# Patient Record
Sex: Male | Born: 1995
Health system: Southern US, Community
[De-identification: ages and names within clinical notes are randomized; demographics above are authoritative.]

---
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
## 2016-04-19 DIAGNOSIS — E291 Testicular hypofunction: Secondary | ICD-10-CM | POA: Diagnosis not present

## 2017-01-03 DIAGNOSIS — Z23 Encounter for immunization: Secondary | ICD-10-CM | POA: Diagnosis not present

## 2017-01-03 DIAGNOSIS — E230 Hypopituitarism: Secondary | ICD-10-CM | POA: Diagnosis not present

## 2017-01-10 IMAGING — MR MR HEAD WO/W CM
15 of 19 series · 35 of 48 positions shown · IV contrast (multihance)
Comparison: None.

CLINICAL DATA: 18-year, four-month-old male with delayed puberty.
Hypogonadism. Recently treated with testosterone injections. Initial
encounter.

EXAM:
MRI HEAD WITHOUT AND WITH CONTRAST
TECHNIQUE: Multiplanar, multiecho pulse sequences of the brain and surrounding
structures were obtained without and with intravenous contrast.
CONTRAST:  8mL MULTIHANCE GADOBENATE DIMEGLUMINE 529 MG/ML IV SOLN

[Series 2: T1 · sagittal · 5.0mm · 0.45mm/px · 3 of 19 slices shown]
[im 1/19]
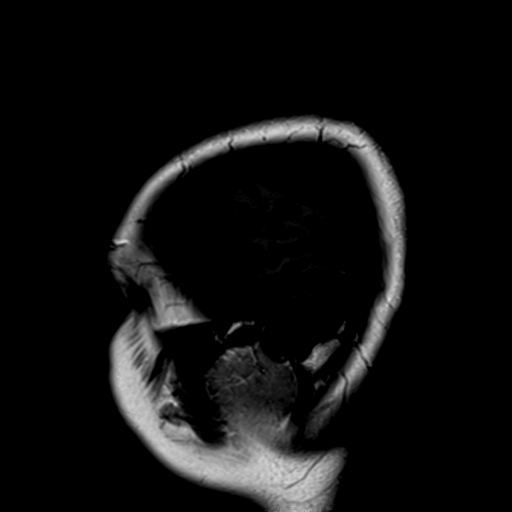
[im 10/19]
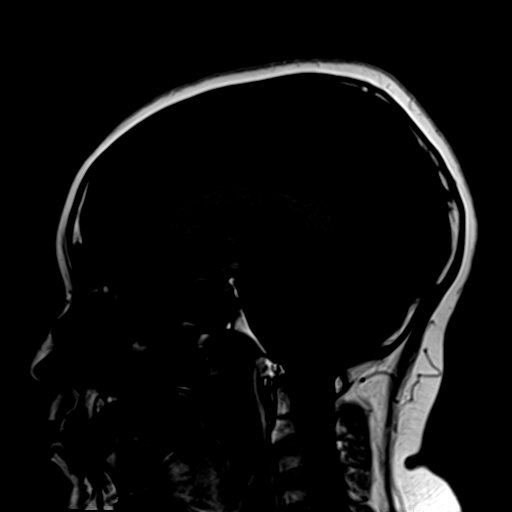
[im 19/19]
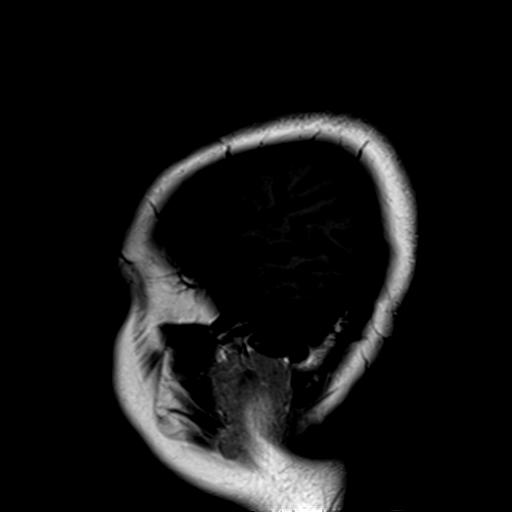

[Series 3: DWI · axial · 3.0mm · 1.80mm/px · z∈[-55,+92]mm · 11 of 100 slices shown]
[im 1/100]
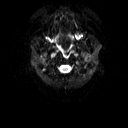
[im 10/100]
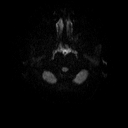
[im 20/100]
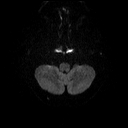
[im 30/100]
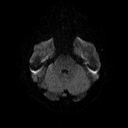
[im 40/100]
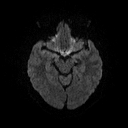
[im 50/100]
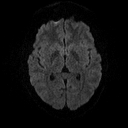
[im 60/100]
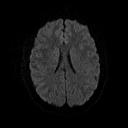
[im 70/100]
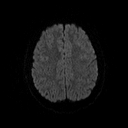
[im 80/100]
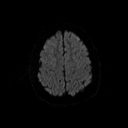
[im 90/100]
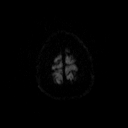
[im 100/100]
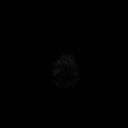

[Series 4: dwi_adc · axial · 3.0mm · 1.80mm/px · z∈[-55,+92]mm · 6 of 50 slices shown]
[im 1/50]
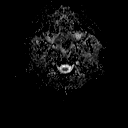
[im 10/50]
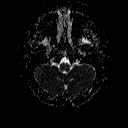
[im 20/50]
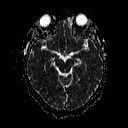
[im 30/50]
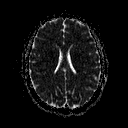
[im 40/50]
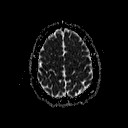
[im 50/50]
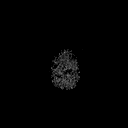

[Series 5: T2 · axial · 5.0mm · 0.36mm/px · z∈[-49,+87]mm · 2 of 22 slices shown]
[im 1/22]
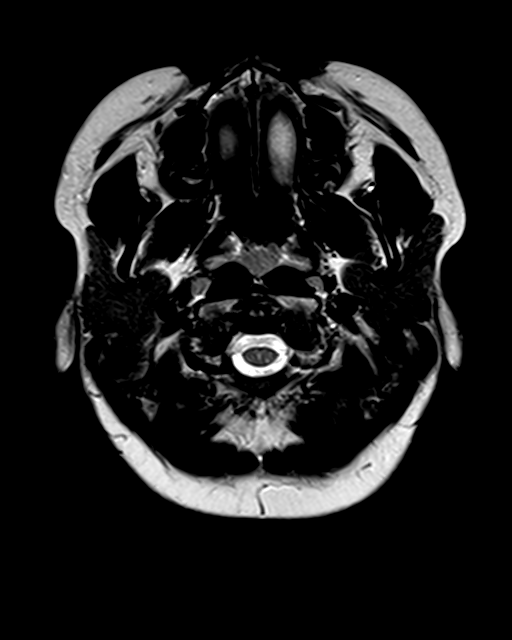
[im 22/22]
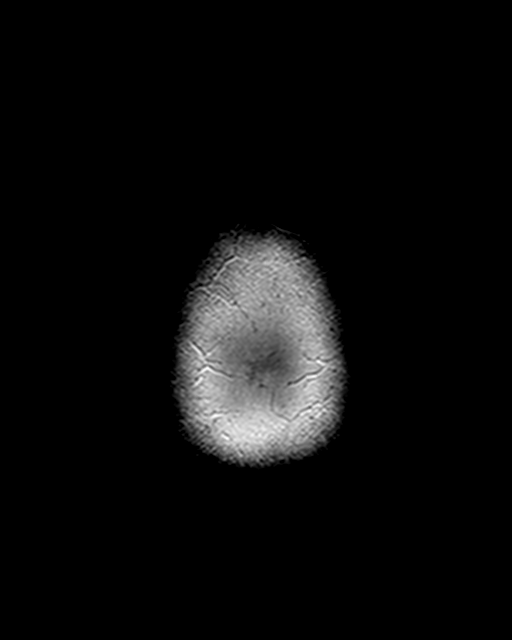

[Series 6: FLAIR · axial · 5.0mm · 0.45mm/px · z∈[-50,+86]mm · 2 of 22 slices shown]
[im 1/22]
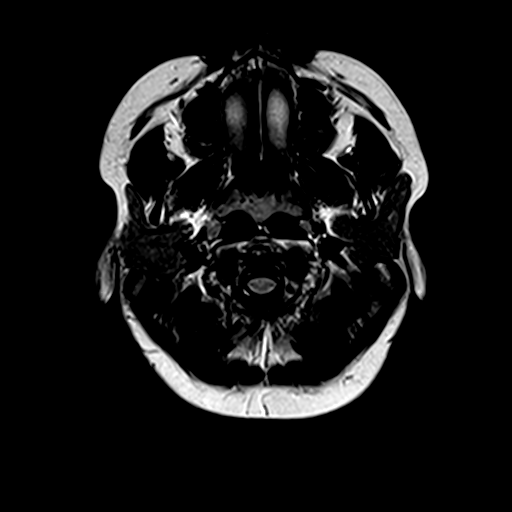
[im 22/22]
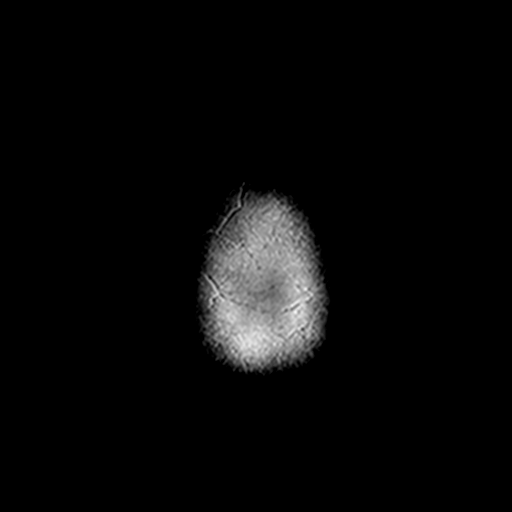

[Series 7: axial grad (blood) · axial · 5.0mm · 0.45mm/px · z∈[-49,+87]mm · 2 of 22 slices shown]
[im 1/22]
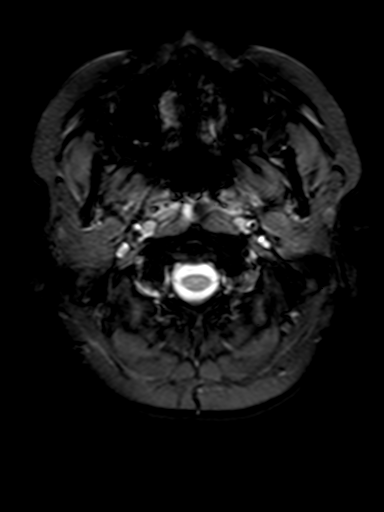
[im 22/22]
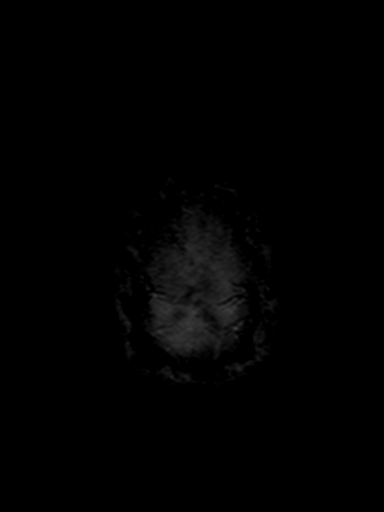

[Series 8: sag 3mm · sagittal · 3.0mm · 0.33mm/px · 1 of 11 slices shown]
[im 1/11]
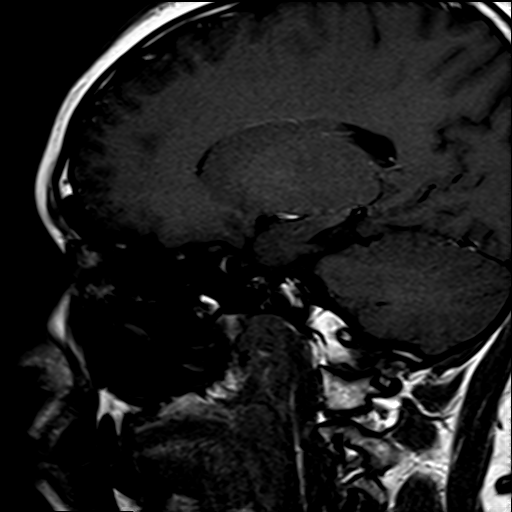

[Series 9: cor 3mm · coronal · 3.0mm · 0.33mm/px · 1 of 11 slices shown]
[im 1/11]
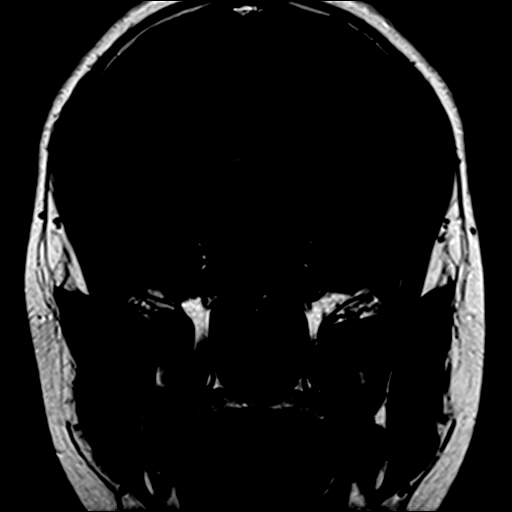

[Series 10: pre cor dynamic · coronal · non-contrast · 3.0mm · 0.35mm/px · 1 of 7 slices shown]
[im 1/7]
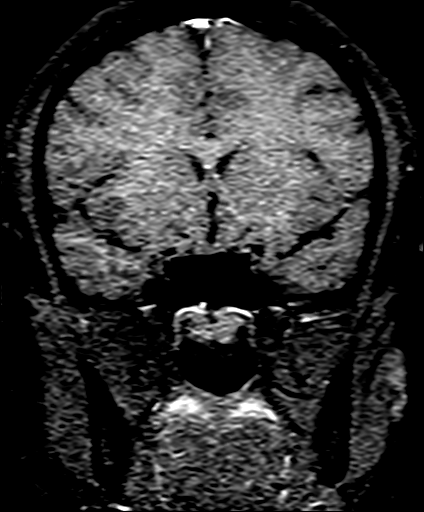

[Series 11: post fs cor · coronal · 3.0mm · 0.35mm/px · 1 of 7 slices shown (1 of 6)]
[im 1/7]
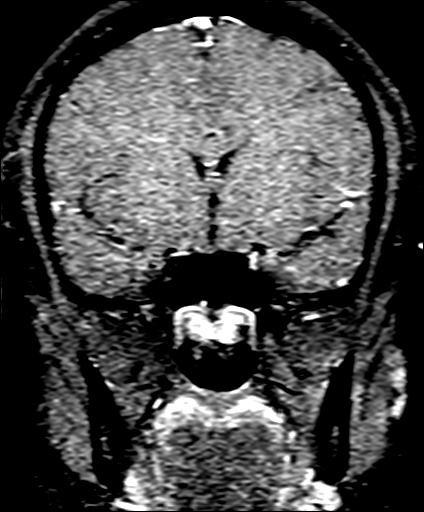

[Series 12: post fs cor · coronal · 3.0mm · 0.35mm/px · 1 of 7 slices shown (2 of 6)]
[im 1/7]
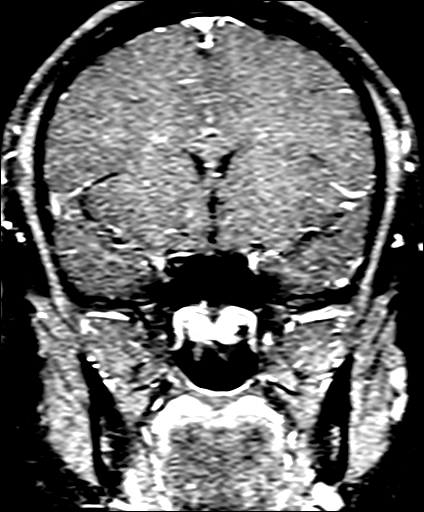

[Series 13: post fs cor · coronal · 3.0mm · 0.35mm/px · 1 of 7 slices shown (3 of 6)]
[im 1/7]
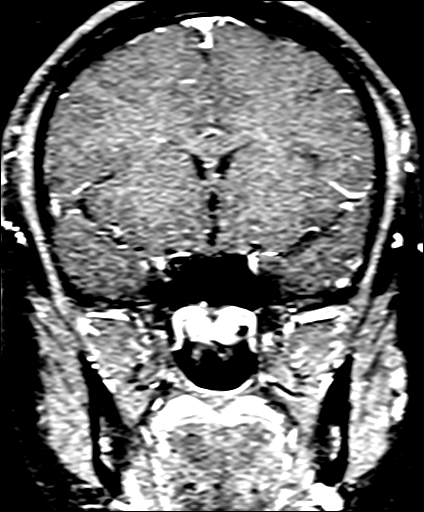

[Series 14: post fs cor · coronal · 3.0mm · 0.35mm/px · 1 of 7 slices shown (4 of 6)]
[im 1/7]
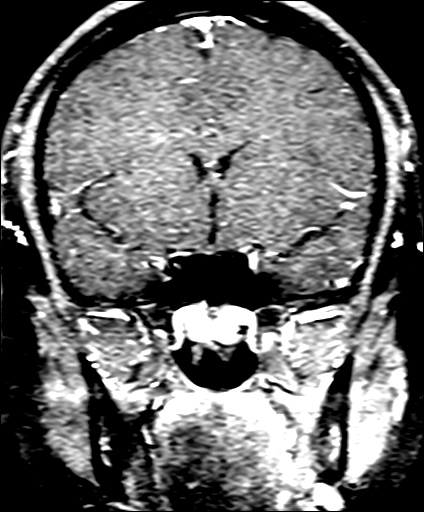

[Series 15: post fs cor · coronal · 3.0mm · 0.35mm/px · 1 of 7 slices shown (5 of 6)]
[im 1/7]
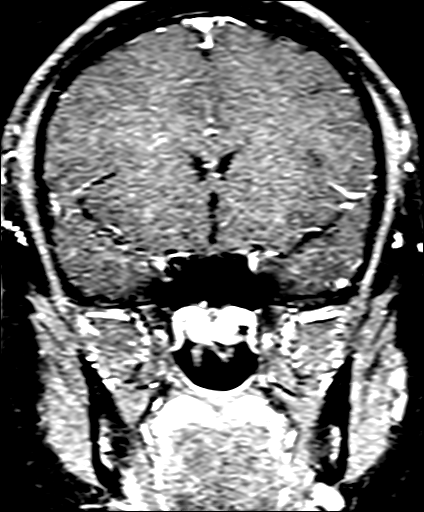

[Series 16: post fs cor · coronal · 3.0mm · 0.35mm/px · 1 of 7 slices shown (6 of 6)]
[im 1/7]
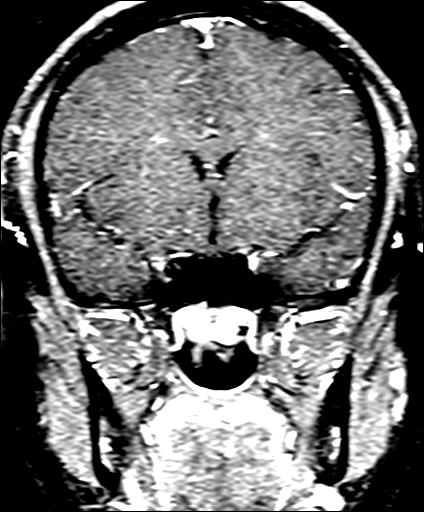

[35 of 48 positions shown; findings below may reference images not displayed]

FINDINGS: Small sella and small pituitary gland with slightly heterogeneous
enhancement without focal mass. Ectopic pituitary bright spot
located at the level of the hypothalamus. This has been described
with pituitary dwarfism.

Superior aspect of the clivus with curvilinear hypointensity which
may represent delayed fusion of the synchondrosis. Primary
notochordal abnormality is a less likely consideration. Attention to
this on follow.

Limited imaging of olfactory region. I suspect the olfactory nerves
are intact. If the patient had anosmia than high-resolution T2
weighted coronal imaging through this region may be considered.

Very prominent adenoidal tissue. Mild prominence palatine tonsils.
Prominent upper neck lymph nodes. These findings may be normal for
this patient and reflect reactive changes from recent infection.
Correlation with CBC and differential to exclude lymphoma may be
considered.

No acute infarct.

No intracranial hemorrhage.

No hydrocephalus.

Cervical medullary junction, pineal region and orbital structures
unremarkable.

Major intracranial vascular structures are patent.
IMPRESSION: Small sella and small pituitary gland with slightly heterogeneous
enhancement without focal mass. Ectopic pituitary bright spot
located at the level of the hypothalamus. This has been described
with pituitary dwarfism.

Suspect delayed fusion of the synchondrosis of the clivus. Attention
to this on follow.

Very prominent adenoidal tissue. Mild prominence palatine tonsils.
Prominent upper neck lymph nodes. These findings may be normal for
this patient and reflect reactive changes from recent infection.
Correlation with CBC and differential to exclude lymphoma may be
considered.

Please see above.

## 2017-07-04 DIAGNOSIS — E230 Hypopituitarism: Secondary | ICD-10-CM | POA: Diagnosis not present

## 2018-03-09 DIAGNOSIS — J029 Acute pharyngitis, unspecified: Secondary | ICD-10-CM | POA: Diagnosis not present

## 2018-03-09 DIAGNOSIS — Z6831 Body mass index (BMI) 31.0-31.9, adult: Secondary | ICD-10-CM | POA: Diagnosis not present

## 2018-03-09 DIAGNOSIS — E6609 Other obesity due to excess calories: Secondary | ICD-10-CM | POA: Diagnosis not present

## 2018-03-09 DIAGNOSIS — Z1389 Encounter for screening for other disorder: Secondary | ICD-10-CM | POA: Diagnosis not present

## 2018-03-09 DIAGNOSIS — J019 Acute sinusitis, unspecified: Secondary | ICD-10-CM | POA: Diagnosis not present

## 2018-06-26 DIAGNOSIS — E230 Hypopituitarism: Secondary | ICD-10-CM | POA: Diagnosis not present

## 2018-10-30 DIAGNOSIS — E230 Hypopituitarism: Secondary | ICD-10-CM | POA: Diagnosis not present

## 2018-10-30 DIAGNOSIS — N62 Hypertrophy of breast: Secondary | ICD-10-CM | POA: Diagnosis not present

## 2018-12-17 DIAGNOSIS — J029 Acute pharyngitis, unspecified: Secondary | ICD-10-CM | POA: Diagnosis not present

## 2018-12-17 DIAGNOSIS — E6609 Other obesity due to excess calories: Secondary | ICD-10-CM | POA: Diagnosis not present

## 2018-12-17 DIAGNOSIS — Z6831 Body mass index (BMI) 31.0-31.9, adult: Secondary | ICD-10-CM | POA: Diagnosis not present

## 2019-03-02 DIAGNOSIS — Z23 Encounter for immunization: Secondary | ICD-10-CM | POA: Diagnosis not present

## 2019-03-24 DIAGNOSIS — Z6831 Body mass index (BMI) 31.0-31.9, adult: Secondary | ICD-10-CM | POA: Diagnosis not present

## 2019-03-24 DIAGNOSIS — R7989 Other specified abnormal findings of blood chemistry: Secondary | ICD-10-CM | POA: Diagnosis not present

## 2019-03-24 DIAGNOSIS — E6609 Other obesity due to excess calories: Secondary | ICD-10-CM | POA: Diagnosis not present

## 2019-03-24 DIAGNOSIS — Z1389 Encounter for screening for other disorder: Secondary | ICD-10-CM | POA: Diagnosis not present

## 2019-03-24 DIAGNOSIS — Z0001 Encounter for general adult medical examination with abnormal findings: Secondary | ICD-10-CM | POA: Diagnosis not present

## 2019-04-27 DIAGNOSIS — Z6830 Body mass index (BMI) 30.0-30.9, adult: Secondary | ICD-10-CM | POA: Diagnosis not present

## 2019-04-27 DIAGNOSIS — E6609 Other obesity due to excess calories: Secondary | ICD-10-CM | POA: Diagnosis not present

## 2019-04-27 DIAGNOSIS — K7689 Other specified diseases of liver: Secondary | ICD-10-CM | POA: Diagnosis not present

## 2019-04-27 DIAGNOSIS — Z23 Encounter for immunization: Secondary | ICD-10-CM | POA: Diagnosis not present

## 2019-04-30 DIAGNOSIS — E230 Hypopituitarism: Secondary | ICD-10-CM | POA: Diagnosis not present

## 2019-05-31 ENCOUNTER — Ambulatory Visit: Payer: Federal, State, Local not specified - PPO | Admitting: Allergy & Immunology

## 2019-06-01 ENCOUNTER — Ambulatory Visit: Payer: Federal, State, Local not specified - PPO | Admitting: Allergy & Immunology

## 2019-06-02 ENCOUNTER — Other Ambulatory Visit: Payer: Self-pay

## 2019-06-02 ENCOUNTER — Encounter: Payer: Self-pay | Admitting: Allergy & Immunology

## 2019-06-02 ENCOUNTER — Ambulatory Visit (INDEPENDENT_AMBULATORY_CARE_PROVIDER_SITE_OTHER): Payer: Federal, State, Local not specified - PPO | Admitting: Allergy & Immunology

## 2019-06-02 VITALS — BP 140/76 | HR 87 | Temp 97.1°F | Resp 16 | Ht 67.72 in | Wt 202.0 lb

## 2019-06-02 DIAGNOSIS — J302 Other seasonal allergic rhinitis: Secondary | ICD-10-CM | POA: Insufficient documentation

## 2019-06-02 DIAGNOSIS — K9049 Malabsorption due to intolerance, not elsewhere classified: Secondary | ICD-10-CM | POA: Diagnosis not present

## 2019-06-02 DIAGNOSIS — J3089 Other allergic rhinitis: Secondary | ICD-10-CM | POA: Diagnosis not present

## 2019-06-02 MED ORDER — MONTELUKAST SODIUM 10 MG PO TABS: 10.0000 mg | ORAL_TABLET | Freq: Every day | ORAL | 5 refills | Status: AC

## 2019-06-02 MED ORDER — TRIAMCINOLONE ACETONIDE 55 MCG/ACT NA AERO: 1.0000 | INHALATION_SPRAY | Freq: Every day | NASAL | 5 refills | Status: AC

## 2019-06-02 NOTE — Patient Instructions (Addendum)
1. Chronic rhinitis - Testing today showed: grasses, weeds, trees, indoor molds, outdoor molds, dust mites, cat, dog and cockroach - Copy of test results provided.  - Avoidance measures provided. - I think we need to be more aggressive with medications to control these symptoms to help with your sleeping at night.  - I would definitely add on a room HEPA filter to help decrease the dander in your room.   - Continue with: Zyrtec (cetirizine) 10mg  tablet once daily (but try to take every day) - Start taking: Singulair (montelukast) 10mg  daily and Nasacort (triamcinolone) one spray per nostril daily - You can use an extra dose of the antihistamine, if needed, for breakthrough symptoms.  - Consider nasal saline rinses 1-2 times daily to remove allergens from the nasal cavities as well as help with mucous clearance (this is especially helpful to do before the nasal sprays are given) - Consider allergy shots as a means of long-term control. - Allergy shots "re-train" and "reset" the immune system to ignore environmental allergens and decrease the resulting immune response to those allergens (sneezing, itchy watery eyes, runny nose, nasal congestion, etc).    - Allergy shots improve symptoms in 75-85% of patients.  - We can discuss more at the next appointment if the medications are not working for you.  2. Food intolerance - Testing was negative to tomato.  - The time course is just no consistent with a food allergy.   3. Return in about 6 weeks (around 07/14/2019). This can be an in-person, a virtual Webex or a telephone follow up visit.   Please inform 1994 of any Emergency Department visits, hospitalizations, or changes in symptoms. Call 09/13/2019 before going to the ED for breathing or allergy symptoms since we might be able to fit you in for a sick visit. Feel free to contact us anytime with any questions, problems, or concerns.  It was a pleasure to meet you today!  Websites that have reliable  patient information: 1. American Academy of Asthma, Allergy, and Immunology: www.aaaai.org 2. Food Allergy Research and Education (FARE): foodallergy.org 3. Mothers of Asthmatics: http://www.asthmacommunitynetwork.org 4. American College of Allergy, Asthma, and Immunology: www.acaai.org   COVID-19 Vaccine Information can be found at: [place] For questions related to vaccine distribution or appointments, please email [email or web address] or call [phone].     "Like" [email or web address] on Facebook and Instagram for our latest updates!       HAPPY SPRING!  Make sure you are registered to vote! If you have moved or changed any of your contact information, you will need to get this updated before voting!  In some cases, you MAY be able to register to vote online: [phone]    Reducing Pollen Exposure  The American Academy of Allergy, Asthma and Immunology suggests the following steps to reduce your exposure to pollen during allergy seasons.    1. Do not hang sheets or clothing out to dry; pollen may collect on these items. 2. Do not mow lawns or spend time around freshly cut grass; mowing stirs up pollen. 3. Keep windows closed at night.  Keep car windows closed while driving. 4. Minimize morning activities outdoors, a time when pollen counts are usually at their highest. 5. Stay indoors as much as possible when pollen counts or humidity is high and on windy days when pollen tends to remain in the air longer. 6. Use air conditioning when possible.  Many air conditioners have filters that trap the pollen spores. 7. Use a HEPA room  air filter to remove pollen form the indoor air you breathe.  Control of Mold Allergen   Mold and fungi can grow on a variety of surfaces provided certain temperature and moisture conditions exist.  Outdoor molds grow on plants, decaying vegetation and soil.   The major outdoor mold, Alternaria and Cladosporium, are found in very high numbers during hot and dry conditions.  Generally, a late Summer - Fall peak is seen for common outdoor fungal spores.  Rain will temporarily lower outdoor mold spore count, but counts rise rapidly when the rainy period ends.  The most important indoor molds are Aspergillus and Penicillium.  Dark, humid and poorly ventilated basements are ideal sites for mold growth.  The next most common sites of mold growth are the bathroom and the kitchen.  Outdoor (Seasonal) Mold Control   1. Use air conditioning and keep windows closed 2. Avoid exposure to decaying vegetation. 3. Avoid leaf raking. 4. Avoid grain handling. 5. Consider wearing a face mask if working in moldy areas.    Indoor (Perennial) Mold Control   1. Maintain humidity below 50%. 2. Clean washable surfaces with 5% bleach solution. 3. Remove sources e.g. contaminated carpets.      Control of Dog or Cat Allergen  Avoidance is the best way to manage a dog or cat allergy. If you have a dog or cat and are allergic to dog or cats, consider removing the dog or cat from the home. If you have a dog or cat but don't want to find it a new home, or if your family wants a pet even though someone in the household is allergic, here are some strategies that may help keep symptoms at bay:  1. Keep the pet out of your bedroom and restrict it to only a few rooms. Be advised that keeping the dog or cat in only one room will not limit the allergens to that room. 2. Don't pet, hug or kiss the dog or cat; if you do, wash your hands with soap and water. 3. High-efficiency particulate air (HEPA) cleaners run continuously in a bedroom or living room can reduce allergen levels over time. 4. Regular use of a high-efficiency vacuum cleaner or a central vacuum can reduce allergen levels. 5. Giving your dog or cat a bath at least once a week can reduce airborne allergen.  Control of  Dust Mite Allergen    Dust mites play a major role in allergic asthma and rhinitis.  They occur in environments with high humidity wherever human skin is found.  Dust mites absorb humidity from the atmosphere (ie, they do not drink) and feed on organic matter (including shed human and animal skin).  Dust mites are a microscopic type of insect that you cannot see with the naked eye.  High levels of dust mites have been detected from mattresses, pillows, carpets, upholstered furniture, bed covers, clothes, soft toys and any woven material.  The principal allergen of the dust mite is found in its feces.  A gram of dust may contain 1,000 mites and 250,000 fecal particles.  Mite antigen is easily measured in the air during house cleaning activities.  Dust mites do not bite and do not cause harm to humans, other than by triggering allergies/asthma.    Ways to decrease your exposure to dust mites in your home:  1. Encase mattresses, box springs and pillows with a mite-impermeable barrier or cover   2. Wash sheets, blankets and drapes weekly in hot water (130  F) with detergent and dry them in a dryer on the hot setting.  3. Have the room cleaned frequently with a vacuum cleaner and a damp dust-mop.  For carpeting or rugs, vacuuming with a vacuum cleaner equipped with a high-efficiency particulate air (HEPA) filter.  The dust mite allergic individual should not be in a room which is being cleaned and should wait 1 hour after cleaning before going into the room. 4. Do not sleep on upholstered furniture (eg, couches).   5. If possible removing carpeting, upholstered furniture and drapery from the home is ideal.  Horizontal blinds should be eliminated in the rooms where the person spends the most time (bedroom, study, television room).  Washable vinyl, roller-type shades are optimal. 6. Remove all non-washable stuffed toys from the bedroom.  Wash stuffed toys weekly like sheets and blankets above.   7. Reduce  indoor humidity to less than 50%.  Inexpensive humidity monitors can be purchased at most hardware stores.  Do not use a humidifier as can make the problem worse and are not recommended.  Control of Cockroach Allergen  Cockroach allergen has been identified as an important cause of acute attacks of asthma, especially in urban settings.  There are fifty-five species of cockroach that exist in the [place], however only three, the [place], [place] species produce allergen that can affect patients with Asthma.  Allergens can be obtained from fecal particles, egg casings and secretions from cockroaches.    1. Remove food sources. 2. Reduce access to water. 3. Seal access and entry points. 4. Spray runways with 0.5-1% Diazinon or Chlorpyrifos 5. Blow boric acid power under stoves and refrigerator. 6. Place bait stations (hydramethylnon) at feeding sites.  Allergy Shots   Allergies are the result of a chain reaction that starts in the immune system. Your immune system controls how your body defends itself. For instance, if you have an allergy to pollen, your immune system identifies pollen as an invader or allergen. Your immune system overreacts by producing antibodies called Immunoglobulin E (IgE). These antibodies travel to cells that release chemicals, causing an allergic reaction.  The concept behind allergy immunotherapy, whether it is received in the form of shots or tablets, is that the immune system can be desensitized to specific allergens that trigger allergy symptoms. Although it requires time and patience, the payback can be long-term relief.  How Do Allergy Shots Work?  Allergy shots work much like a vaccine. Your body responds to injected amounts of a particular allergen given in increasing doses, eventually developing a resistance and tolerance to it. Allergy shots can lead to decreased, minimal or no allergy symptoms.  There generally are two phases: build-up and  maintenance. Build-up often ranges from three to six months and involves receiving injections with increasing amounts of the allergens. The shots are typically given once or twice a week, though more rapid build-up schedules are sometimes used.  The maintenance phase begins when the most effective dose is reached. This dose is different for each person, depending on how allergic you are and your response to the build-up injections. Once the maintenance dose is reached, there are longer periods between injections, typically two to four weeks.  Occasionally doctors give cortisone-type shots that can temporarily reduce allergy symptoms. These types of shots are different and should not be confused with allergy immunotherapy shots.  Who Can Be Treated with Allergy Shots?  Allergy shots may be a good treatment approach for people with allergic rhinitis (hay  fever), allergic asthma, conjunctivitis (eye allergy) or stinging insect allergy.   Before deciding to begin allergy shots, you should consider:  . The length of allergy season and the severity of your symptoms . Whether medications and/or changes to your environment can control your symptoms . Your desire to avoid long-term medication use . Time: allergy immunotherapy requires a major time commitment . Cost: may vary depending on your insurance coverage  Allergy shots for children age 36 and older are effective and often well tolerated. They might prevent the onset of new allergen sensitivities or the progression to asthma.  Allergy shots are not started on patients who are pregnant but can be continued on patients who become pregnant while receiving them. In some patients with other medical conditions or who take certain common medications, allergy shots may be of risk. It is important to mention other medications you talk to your allergist.   When Will I Feel Better?  Some may experience decreased allergy symptoms during the build-up  phase. For others, it may take as long as 12 months on the maintenance dose. If there is no improvement after a year of maintenance, your allergist will discuss other treatment options with you.  If you aren't responding to allergy shots, it may be because there is not enough dose of the allergen in your vaccine or there are missing allergens that were not identified during your allergy testing. Other reasons could be that there are high levels of the allergen in your environment or major exposure to non-allergic triggers like tobacco smoke.  What Is the Length of Treatment?  Once the maintenance dose is reached, allergy shots are generally continued for three to five years. The decision to stop should be discussed with your allergist at that time. Some people may experience a permanent reduction of allergy symptoms. Others may relapse and a longer course of allergy shots can be considered.  What Are the Possible Reactions?  The two types of adverse reactions that can occur with allergy shots are local and systemic. Common local reactions include very mild redness and swelling at the injection site, which can happen immediately or several hours after. A systemic reaction, which is less common, affects the entire body or a particular body system. They are usually mild and typically respond quickly to medications. Signs include increased allergy symptoms such as sneezing, a stuffy nose or hives.  Rarely, a serious systemic reaction called anaphylaxis can develop. Symptoms include swelling in the throat, wheezing, a feeling of tightness in the chest, nausea or dizziness. Most serious systemic reactions develop within 30 minutes of allergy shots. This is why it is strongly recommended you wait in your doctor's office for 30 minutes after your injections. Your allergist is trained to watch for reactions, and his or her staff is trained and equipped with the proper medications to identify and treat  them.  Who Should Administer Allergy Shots?  The preferred location for receiving shots is your prescribing allergist's office. Injections can sometimes be given at another facility where the physician and staff are trained to recognize and treat reactions, and have received instructions by your prescribing allergist.

## 2019-06-02 NOTE — Progress Notes (Signed)
NEW PATIENT  Date of Service/Encounter:  06/02/19  Referring provider: Sharilyn Sites, MD   Assessment:   Seasonal and perennial allergic rhinitis (grasses, weeds, trees, indoor molds, outdoor molds, dust mites, cat, dog and cockroach)  Food intolerance (tomato)  Large local reaction to insect stings  Plan/Recommendations:   1. Chronic rhinitis - Testing today showed: grasses, weeds, trees, indoor molds, outdoor molds, dust mites, cat, dog and cockroach - Copy of test results provided.  - Avoidance measures provided. - I think we need to be more aggressive with medications to control these symptoms to help with your sleeping at night.  - I would definitely add on a room HEPA filter to help decrease the dander in your room.   - Continue with: Zyrtec (cetirizine) 48m tablet once daily (but try to take every day) - Start taking: Singulair (montelukast) 132mdaily and Nasacort (triamcinolone) one spray per nostril daily - You can use an extra dose of the antihistamine, if needed, for breakthrough symptoms.  - Consider nasal saline rinses 1-2 times daily to remove allergens from the nasal cavities as well as help with mucous clearance (this is especially helpful to do before the nasal sprays are given) - Consider allergy shots as a means of long-term control. - Allergy shots "re-train" and "reset" the immune system to ignore environmental allergens and decrease the resulting immune response to those allergens (sneezing, itchy watery eyes, runny nose, nasal congestion, etc).    - Allergy shots improve symptoms in 75-85% of patients.  - We can discuss more at the next appointment if the medications are not working for you.  2. Food intolerance - Testing was negative to tomato.  - The time course is just no consistent with a food allergy.   3. Return in about 6 weeks (around 07/14/2019). This can be an in-person, a virtual Webex or a telephone follow up visit.   Subjective:   KaNEMIAH KISSNERs a 24.0[age] male presenting today for evaluation of  Chief Complaint  Patient presents with  . Allergy Testing    tomato base products lips burning and bumps, new puppy     Derrico A Caissie has a history of the following: Patient Active Problem List   Diagnosis Date Noted  . Seasonal and perennial allergic rhinitis 06/02/2019  . Food intolerance 06/02/2019  . Pubertal delay 09/22/2013  . Gynecomastia, male 09/22/2013  . COLLES' FRACTURE, LEFT WRIST 04/17/2009  . TENDINITIS, TIBIALIS 01/14/2008    History obtained from: chart review and patient.  KaTilden Domeas referred by GoSharilyn SitesMD.     KaMillss a 24.0[age] male presenting for an evaluation of allergic rhinitis and possible food allergies.   Allergic Rhinitis Symptom History: He reports that he has seasonal allergic rhinitis with itchy watery eyes.  He also has sneezing as well as itching on the top of his mouth.  He endorses alternating runny and stuffy nose.  He does have postnasal drip with throat clearing.  Zyrtec seems to help during the day.  Symptoms are definitely worse in the spring.  He does not use any nose spray consistently.  He has not ever been tested for environmental allergies. He does have a goldendoodle at home which they adopted around 2 months ago.  Food Allergy Symptom History: He reports getting bumps With exposure to tomato.  He also has some lip tingling and burning.  These bumps are around his mouth.  It started in August 2020.  Symptoms will last for around 3 days.  He does not have any issues with ketchup, but he does with other processed tomato products including pizza sauce and spaghetti sauce.  These are fairly itchy. He otherwise tolerates all of the major food allergens without adverse event.    Stinging Insect Symptom History: He does report a large local reaction on his right calf after being stung several years ago.  He had no respiratory or GI issues.  He has never been tested  for seeing insect allergies.  He does not have an EpiPen.  Otherwise, there is no history of other atopic diseases, including asthma, drug allergies, eczema, urticaria or contact dermatitis. There is no significant infectious history. Vaccinations are up to date.     Past Medical History: Patient Active Problem List   Diagnosis Date Noted  . Seasonal and perennial allergic rhinitis 06/02/2019  . Food intolerance 06/02/2019  . Pubertal delay 09/22/2013  . Gynecomastia, male 09/22/2013  . COLLES' FRACTURE, LEFT WRIST 04/17/2009  . TENDINITIS, TIBIALIS 01/14/2008    Medication List:  Allergies as of 06/02/2019   No Known Allergies     Medication List       Accurate as of June 02, 2019 11:59 PM. If you have any questions, ask your nurse or doctor.        STOP taking these medications   clomiPHENE 50 MG tablet Commonly known as: Clomid Stopped by: Valentina Shaggy, MD     TAKE these medications   montelukast 10 MG tablet Commonly known as: Singulair Take 1 tablet (10 mg total) by mouth at bedtime. Started by: Valentina Shaggy, MD   testosterone 50 MG/5GM (1%) Gel Commonly known as: ANDROGEL SMARTSIG:2 Packet(s) Topical Every Morning   triamcinolone 55 MCG/ACT Aero nasal inhaler Commonly known as: NASACORT Place 1 spray into the nose daily. Started by: Valentina Shaggy, MD       Birth History: born at term without complications  Developmental History: Casimiro has met all milestones on time. He has required no speech therapy, occupational therapy and physical therapy.   Past Surgical History: History reviewed. No pertinent surgical history.   Family History: Family History  Problem Relation Age of Onset  . Asthma Father   . Allergic rhinitis Brother   . Allergic rhinitis Maternal Grandmother   . Eczema Neg Hx   . Urticaria Neg Hx      Social History: Diquan lives at home with his family.  He was living near his grad school, but he was only an  in person class for a few weeks before it all went virtual.  Therefore, he moved back in with his parents.  He will be moving back once things open up a bit more.  He is currently getting a graduate degree in kinesiology and is considering pursuing a PA program.  He has an undergrad in Education officer, museum.  He is getting his graduate degree at [employer].  Currently he lives in a house that is 23 years old.  There is hardwood throughout the home.  There is carpeting in the bedrooms.  They have the 1 dog at home.  There are no dust mite coverings on the bedding. There is no tobacco exposure in the home. There is no chemical or dust exposure. There is no HEPA filter in the home. They do not live in an industrial area or near an interstate.    Review of Systems  Constitutional: Negative.  Negative for chills, fever, malaise/fatigue  and weight loss.  HENT: Positive for congestion and sinus pain. Negative for ear discharge and ear pain.        Positive for postnasal drip.  Eyes: Positive for redness. Negative for pain and discharge.  Respiratory: Negative for cough, sputum production, shortness of breath and wheezing.   Cardiovascular: Negative.  Negative for chest pain and palpitations.  Gastrointestinal: Negative for abdominal pain, constipation, diarrhea, heartburn, nausea and vomiting.  Skin: Negative.  Negative for itching and rash.  Neurological: Negative for dizziness and headaches.  Endo/Heme/Allergies: Negative for environmental allergies. Does not bruise/bleed easily.       Objective:   Blood pressure 140/76, pulse 87, temperature (!) 97.1 F (36.2 C), temperature source Temporal, resp. rate 16, height 5' 7.72" (1.72 m), weight 202 lb (91.6 kg), SpO2 98 %. Body mass index is 30.97 kg/m.   Physical Exam:   Physical Exam  Constitutional: He appears well-developed and well-nourished.  Very courteous male.  HENT:  Head: Normocephalic and atraumatic.  Right Ear: Tympanic membrane, external ear  and ear canal normal. No drainage, swelling or tenderness. Tympanic membrane is not injected, not scarred, not erythematous, not retracted and not bulging.  Left Ear: Tympanic membrane, external ear and ear canal normal. No drainage, swelling or tenderness. Tympanic membrane is not injected, not scarred, not erythematous, not retracted and not bulging.  Nose: Mucosal edema and rhinorrhea present. No nasal deformity or septal deviation. No epistaxis. Right sinus exhibits no maxillary sinus tenderness and no frontal sinus tenderness. Left sinus exhibits no maxillary sinus tenderness and no frontal sinus tenderness.  Mouth/Throat: Uvula is midline and oropharynx is clear and moist. Mucous membranes are not pale and not dry.  Cobblestoning present in the posterior oropharynx. Tonsils unremarkable bilaterally.   Eyes: Pupils are equal, round, and reactive to light. Conjunctivae and EOM are normal. Right eye exhibits no chemosis and no discharge. Left eye exhibits no chemosis and no discharge. Right conjunctiva is not injected. Left conjunctiva is not injected.  Cardiovascular: Normal rate, regular rhythm and normal heart sounds.  Respiratory: Effort normal and breath sounds normal. No accessory muscle usage. No tachypnea. No respiratory distress. He has no wheezes. He has no rhonchi. He has no rales. He exhibits no tenderness.  Moving air well in all lung fields. No increased work of breathing noted.   GI: There is no abdominal tenderness. There is no rebound and no guarding.  Lymphadenopathy:       Head (right side): No submandibular, no tonsillar and no occipital adenopathy present.       Head (left side): No submandibular, no tonsillar and no occipital adenopathy present.    He has no cervical adenopathy.  Neurological: He is alert.  Skin: No abrasion, no petechiae and no rash noted. Rash is not papular, not vesicular and not urticarial. No erythema. No pallor.  Psychiatric: He has a normal mood and  affect.     Diagnostic studies:     Allergy Studies:    Airborne Adult Perc - 06/02/19 0921    Time Antigen Placed  0915    Allergen Manufacturer  Lavella Hammock    Location  Back    Number of Test  60    Panel 1  Select    1. Control-Buffer 50% Glycerol  Negative    2. Control-Histamine 1 mg/ml  2+    3. Albumin saline  Negative    4. Alum Rock  3+    5. [place]  3+    6. [employer]  3+    7. [place] Blue  4+    8. Meadow Fescue  3+    9. Perennial Rye  3+    10. Sweet Vernal  3+    11. Timothy  3+    12. Cocklebur  Negative    13. Burweed Marshelder  Negative    14. Ragweed, short  Negative    15. Ragweed, Giant  Negative    16. Plantain,  English  Negative    17. Lamb's Quarters  Negative    18. Sheep Sorrell  Negative    19. Rough Pigweed  Negative    20. Marsh Elder, Rough  Negative    21. Mugwort, Common  Negative    22. Ash mix  Negative    23. Birch mix  Negative    24. Beech American  Negative    25. Box, Elder  Negative    26. Cedar, red  Negative    27. Cottonwood, [place]  Negative    28. Elm mix  Negative    29. Hickory mix  4+    30. Maple mix  2+    31. Oak, [place] mix  Negative    32. Pecan Pollen  Negative    33. Pine mix  Negative    34. Sycamore Eastern  Negative    35. [place], Black Pollen  2+    36. Alternaria alternata  Negative    37. Cladosporium Herbarum  Negative    38. Aspergillus mix  Negative    39. Penicillium mix  Negative    40. Bipolaris sorokiniana (Helminthosporium)  Negative    41. Drechslera spicifera (Curvularia)  2+    42. Mucor plumbeus  Negative    43. Fusarium moniliforme  4+    44. Aureobasidium pullulans (pullulara)  Negative    45. Rhizopus oryzae  Negative    46. Botrytis cinera  Negative    47. Epicoccum nigrum  Negative    48. Phoma betae  Negative    49. Candida Albicans  Negative    50. Trichophyton mentagrophytes  Negative    51. Mite, D Farinae  5,000 AU/ml  4+    52. Mite, D Pteronyssinus  5,000 AU/ml  4+    53.  Cat Hair 10,000 BAU/ml  4+    54.  Dog Epithelia  Negative    55. Mixed Feathers  Negative    56. Horse Epithelia  Negative    57. Cockroach, German  Negative    58. Mouse  Negative    59. Tobacco Leaf  Negative     Intradermal - 06/02/19 0948    Time Antigen Placed  0940    Allergen Manufacturer  Lavella Hammock    Location  Arm    Number of Test  7    Control  Negative    Ragweed mix  Negative    Weed mix  3+    Mold 1  Negative    Mold 2  1+    Dog  4+    Cockroach  3+     Food Adult Perc - 06/02/19 0900    Time Antigen Placed  3976    Allergen Manufacturer  Lavella Hammock    Location  Back    Number of allergen test  1    42. Tomato  Negative       Allergy testing results were read and interpreted by myself, documented by clinical staff.         Salvatore Marvel,  MD [facility]

## 2019-06-03 ENCOUNTER — Encounter: Payer: Self-pay | Admitting: Allergy & Immunology

## 2019-06-10 DIAGNOSIS — Z23 Encounter for immunization: Secondary | ICD-10-CM | POA: Diagnosis not present

## 2019-06-14 ENCOUNTER — Ambulatory Visit: Payer: Federal, State, Local not specified - PPO | Admitting: Allergy

## 2019-07-01 DIAGNOSIS — E230 Hypopituitarism: Secondary | ICD-10-CM | POA: Diagnosis not present

## 2019-07-02 DIAGNOSIS — Z23 Encounter for immunization: Secondary | ICD-10-CM | POA: Diagnosis not present

## 2019-07-19 ENCOUNTER — Ambulatory Visit: Payer: Federal, State, Local not specified - PPO | Admitting: Family Medicine

## 2019-07-23 DIAGNOSIS — E230 Hypopituitarism: Secondary | ICD-10-CM | POA: Diagnosis not present

## 2019-08-26 DIAGNOSIS — E230 Hypopituitarism: Secondary | ICD-10-CM | POA: Diagnosis not present

## 2019-08-26 DIAGNOSIS — M729 Fibroblastic disorder, unspecified: Secondary | ICD-10-CM | POA: Diagnosis not present

## 2019-08-26 DIAGNOSIS — N4611 Organic oligospermia: Secondary | ICD-10-CM | POA: Diagnosis not present

## 2019-08-27 DIAGNOSIS — Z3141 Encounter for fertility testing: Secondary | ICD-10-CM | POA: Diagnosis not present

## 2019-08-27 DIAGNOSIS — Z113 Encounter for screening for infections with a predominantly sexual mode of transmission: Secondary | ICD-10-CM | POA: Diagnosis not present

## 2019-10-29 DIAGNOSIS — E230 Hypopituitarism: Secondary | ICD-10-CM | POA: Diagnosis not present

## 2019-10-29 DIAGNOSIS — M8588 Other specified disorders of bone density and structure, other site: Secondary | ICD-10-CM | POA: Diagnosis not present

## 2019-10-29 DIAGNOSIS — E236 Other disorders of pituitary gland: Secondary | ICD-10-CM | POA: Diagnosis not present

## 2019-12-22 DIAGNOSIS — E230 Hypopituitarism: Secondary | ICD-10-CM | POA: Diagnosis not present

## 2020-01-12 DIAGNOSIS — Z23 Encounter for immunization: Secondary | ICD-10-CM | POA: Diagnosis not present

## 2020-01-12 DIAGNOSIS — E230 Hypopituitarism: Secondary | ICD-10-CM | POA: Diagnosis not present

## 2020-03-08 DIAGNOSIS — E230 Hypopituitarism: Secondary | ICD-10-CM | POA: Diagnosis not present

## 2020-05-10 DIAGNOSIS — L905 Scar conditions and fibrosis of skin: Secondary | ICD-10-CM | POA: Diagnosis not present

## 2020-05-10 DIAGNOSIS — N62 Hypertrophy of breast: Secondary | ICD-10-CM | POA: Diagnosis not present

## 2020-06-13 DIAGNOSIS — S80862A Insect bite (nonvenomous), left lower leg, initial encounter: Secondary | ICD-10-CM | POA: Diagnosis not present

## 2020-06-13 DIAGNOSIS — K529 Noninfective gastroenteritis and colitis, unspecified: Secondary | ICD-10-CM | POA: Diagnosis not present

## 2020-06-13 DIAGNOSIS — W57XXXA Bitten or stung by nonvenomous insect and other nonvenomous arthropods, initial encounter: Secondary | ICD-10-CM | POA: Diagnosis not present

## 2020-07-06 DIAGNOSIS — E230 Hypopituitarism: Secondary | ICD-10-CM | POA: Diagnosis not present

## 2020-07-13 DIAGNOSIS — E230 Hypopituitarism: Secondary | ICD-10-CM | POA: Diagnosis not present

## 2020-08-21 DIAGNOSIS — Z6824 Body mass index (BMI) 24.0-24.9, adult: Secondary | ICD-10-CM | POA: Diagnosis not present

## 2020-08-21 DIAGNOSIS — R7989 Other specified abnormal findings of blood chemistry: Secondary | ICD-10-CM | POA: Diagnosis not present

## 2020-08-21 DIAGNOSIS — Z1331 Encounter for screening for depression: Secondary | ICD-10-CM | POA: Diagnosis not present

## 2020-08-21 DIAGNOSIS — F411 Generalized anxiety disorder: Secondary | ICD-10-CM | POA: Diagnosis not present

## 2020-10-07 DIAGNOSIS — F411 Generalized anxiety disorder: Secondary | ICD-10-CM | POA: Diagnosis not present

## 2020-10-07 DIAGNOSIS — F330 Major depressive disorder, recurrent, mild: Secondary | ICD-10-CM | POA: Diagnosis not present

## 2020-10-12 DIAGNOSIS — F411 Generalized anxiety disorder: Secondary | ICD-10-CM | POA: Diagnosis not present

## 2020-10-12 DIAGNOSIS — F330 Major depressive disorder, recurrent, mild: Secondary | ICD-10-CM | POA: Diagnosis not present

## 2020-10-21 DIAGNOSIS — F411 Generalized anxiety disorder: Secondary | ICD-10-CM | POA: Diagnosis not present

## 2020-10-21 DIAGNOSIS — F330 Major depressive disorder, recurrent, mild: Secondary | ICD-10-CM | POA: Diagnosis not present

## 2020-10-28 DIAGNOSIS — F330 Major depressive disorder, recurrent, mild: Secondary | ICD-10-CM | POA: Diagnosis not present

## 2020-10-28 DIAGNOSIS — F411 Generalized anxiety disorder: Secondary | ICD-10-CM | POA: Diagnosis not present

## 2020-11-04 DIAGNOSIS — F330 Major depressive disorder, recurrent, mild: Secondary | ICD-10-CM | POA: Diagnosis not present

## 2020-11-04 DIAGNOSIS — F411 Generalized anxiety disorder: Secondary | ICD-10-CM | POA: Diagnosis not present

## 2020-11-08 DIAGNOSIS — J069 Acute upper respiratory infection, unspecified: Secondary | ICD-10-CM | POA: Diagnosis not present

## 2020-11-08 DIAGNOSIS — R070 Pain in throat: Secondary | ICD-10-CM | POA: Diagnosis not present

## 2020-11-11 DIAGNOSIS — F411 Generalized anxiety disorder: Secondary | ICD-10-CM | POA: Diagnosis not present

## 2020-11-11 DIAGNOSIS — F330 Major depressive disorder, recurrent, mild: Secondary | ICD-10-CM | POA: Diagnosis not present

## 2020-11-18 DIAGNOSIS — F411 Generalized anxiety disorder: Secondary | ICD-10-CM | POA: Diagnosis not present

## 2020-11-18 DIAGNOSIS — F330 Major depressive disorder, recurrent, mild: Secondary | ICD-10-CM | POA: Diagnosis not present

## 2020-11-25 DIAGNOSIS — F330 Major depressive disorder, recurrent, mild: Secondary | ICD-10-CM | POA: Diagnosis not present

## 2020-11-25 DIAGNOSIS — F411 Generalized anxiety disorder: Secondary | ICD-10-CM | POA: Diagnosis not present

## 2020-12-02 DIAGNOSIS — F330 Major depressive disorder, recurrent, mild: Secondary | ICD-10-CM | POA: Diagnosis not present

## 2020-12-02 DIAGNOSIS — F411 Generalized anxiety disorder: Secondary | ICD-10-CM | POA: Diagnosis not present

## 2020-12-16 DIAGNOSIS — F330 Major depressive disorder, recurrent, mild: Secondary | ICD-10-CM | POA: Diagnosis not present

## 2020-12-16 DIAGNOSIS — F411 Generalized anxiety disorder: Secondary | ICD-10-CM | POA: Diagnosis not present

## 2020-12-23 DIAGNOSIS — F330 Major depressive disorder, recurrent, mild: Secondary | ICD-10-CM | POA: Diagnosis not present

## 2020-12-23 DIAGNOSIS — F411 Generalized anxiety disorder: Secondary | ICD-10-CM | POA: Diagnosis not present

## 2020-12-30 DIAGNOSIS — F330 Major depressive disorder, recurrent, mild: Secondary | ICD-10-CM | POA: Diagnosis not present

## 2020-12-30 DIAGNOSIS — F411 Generalized anxiety disorder: Secondary | ICD-10-CM | POA: Diagnosis not present

## 2021-01-06 DIAGNOSIS — F411 Generalized anxiety disorder: Secondary | ICD-10-CM | POA: Diagnosis not present

## 2021-01-06 DIAGNOSIS — F330 Major depressive disorder, recurrent, mild: Secondary | ICD-10-CM | POA: Diagnosis not present

## 2021-01-13 DIAGNOSIS — F330 Major depressive disorder, recurrent, mild: Secondary | ICD-10-CM | POA: Diagnosis not present

## 2021-01-13 DIAGNOSIS — F411 Generalized anxiety disorder: Secondary | ICD-10-CM | POA: Diagnosis not present

## 2021-01-20 DIAGNOSIS — F411 Generalized anxiety disorder: Secondary | ICD-10-CM | POA: Diagnosis not present

## 2021-01-20 DIAGNOSIS — F330 Major depressive disorder, recurrent, mild: Secondary | ICD-10-CM | POA: Diagnosis not present

## 2021-02-03 DIAGNOSIS — F411 Generalized anxiety disorder: Secondary | ICD-10-CM | POA: Diagnosis not present

## 2021-02-03 DIAGNOSIS — F330 Major depressive disorder, recurrent, mild: Secondary | ICD-10-CM | POA: Diagnosis not present

## 2021-02-07 DIAGNOSIS — E230 Hypopituitarism: Secondary | ICD-10-CM | POA: Diagnosis not present

## 2021-02-07 DIAGNOSIS — Z5189 Encounter for other specified aftercare: Secondary | ICD-10-CM | POA: Diagnosis not present

## 2021-02-17 DIAGNOSIS — F330 Major depressive disorder, recurrent, mild: Secondary | ICD-10-CM | POA: Diagnosis not present

## 2021-02-17 DIAGNOSIS — F411 Generalized anxiety disorder: Secondary | ICD-10-CM | POA: Diagnosis not present

## 2021-03-10 DIAGNOSIS — F411 Generalized anxiety disorder: Secondary | ICD-10-CM | POA: Diagnosis not present

## 2021-03-10 DIAGNOSIS — F330 Major depressive disorder, recurrent, mild: Secondary | ICD-10-CM | POA: Diagnosis not present

## 2021-03-24 DIAGNOSIS — F411 Generalized anxiety disorder: Secondary | ICD-10-CM | POA: Diagnosis not present

## 2021-03-24 DIAGNOSIS — F330 Major depressive disorder, recurrent, mild: Secondary | ICD-10-CM | POA: Diagnosis not present

## 2021-03-31 DIAGNOSIS — F330 Major depressive disorder, recurrent, mild: Secondary | ICD-10-CM | POA: Diagnosis not present

## 2021-04-07 DIAGNOSIS — F330 Major depressive disorder, recurrent, mild: Secondary | ICD-10-CM | POA: Diagnosis not present

## 2021-04-21 DIAGNOSIS — F330 Major depressive disorder, recurrent, mild: Secondary | ICD-10-CM | POA: Diagnosis not present

## 2021-04-28 DIAGNOSIS — F330 Major depressive disorder, recurrent, mild: Secondary | ICD-10-CM | POA: Diagnosis not present

## 2021-05-09 DIAGNOSIS — L905 Scar conditions and fibrosis of skin: Secondary | ICD-10-CM | POA: Diagnosis not present

## 2021-05-09 DIAGNOSIS — L810 Postinflammatory hyperpigmentation: Secondary | ICD-10-CM | POA: Diagnosis not present

## 2021-05-12 DIAGNOSIS — F330 Major depressive disorder, recurrent, mild: Secondary | ICD-10-CM | POA: Diagnosis not present

## 2021-05-26 DIAGNOSIS — F330 Major depressive disorder, recurrent, mild: Secondary | ICD-10-CM | POA: Diagnosis not present

## 2021-06-06 DIAGNOSIS — F330 Major depressive disorder, recurrent, mild: Secondary | ICD-10-CM | POA: Diagnosis not present

## 2021-06-16 DIAGNOSIS — F330 Major depressive disorder, recurrent, mild: Secondary | ICD-10-CM | POA: Diagnosis not present

## 2021-06-30 DIAGNOSIS — F330 Major depressive disorder, recurrent, mild: Secondary | ICD-10-CM | POA: Diagnosis not present

## 2021-07-14 DIAGNOSIS — F330 Major depressive disorder, recurrent, mild: Secondary | ICD-10-CM | POA: Diagnosis not present

## 2021-07-19 DIAGNOSIS — Z79890 Hormone replacement therapy (postmenopausal): Secondary | ICD-10-CM | POA: Diagnosis not present

## 2021-07-19 DIAGNOSIS — E230 Hypopituitarism: Secondary | ICD-10-CM | POA: Diagnosis not present

## 2021-07-19 DIAGNOSIS — Z79899 Other long term (current) drug therapy: Secondary | ICD-10-CM | POA: Diagnosis not present

## 2021-07-19 DIAGNOSIS — Z5189 Encounter for other specified aftercare: Secondary | ICD-10-CM | POA: Diagnosis not present

## 2021-07-21 DIAGNOSIS — F330 Major depressive disorder, recurrent, mild: Secondary | ICD-10-CM | POA: Diagnosis not present

## 2021-07-28 DIAGNOSIS — F330 Major depressive disorder, recurrent, mild: Secondary | ICD-10-CM | POA: Diagnosis not present

## 2021-08-11 DIAGNOSIS — F330 Major depressive disorder, recurrent, mild: Secondary | ICD-10-CM | POA: Diagnosis not present

## 2021-09-01 DIAGNOSIS — F330 Major depressive disorder, recurrent, mild: Secondary | ICD-10-CM | POA: Diagnosis not present

## 2021-10-06 DIAGNOSIS — F330 Major depressive disorder, recurrent, mild: Secondary | ICD-10-CM | POA: Diagnosis not present

## 2021-10-24 DIAGNOSIS — F330 Major depressive disorder, recurrent, mild: Secondary | ICD-10-CM | POA: Diagnosis not present

## 2021-12-08 DIAGNOSIS — F330 Major depressive disorder, recurrent, mild: Secondary | ICD-10-CM | POA: Diagnosis not present

## 2022-01-22 DIAGNOSIS — E230 Hypopituitarism: Secondary | ICD-10-CM | POA: Diagnosis not present

## 2022-01-31 DIAGNOSIS — E230 Hypopituitarism: Secondary | ICD-10-CM | POA: Diagnosis not present

## 2022-01-31 DIAGNOSIS — Z5189 Encounter for other specified aftercare: Secondary | ICD-10-CM | POA: Diagnosis not present

## 2022-02-16 DIAGNOSIS — F330 Major depressive disorder, recurrent, mild: Secondary | ICD-10-CM | POA: Diagnosis not present

## 2024-06-28 ENCOUNTER — Ambulatory Visit: Admitting: Urology
